# Patient Record
Sex: Male | Born: 1995 | Race: White | Hispanic: No | Marital: Single | Smoking: Current every day smoker
Health system: Southern US, Community
[De-identification: ages and names within clinical notes are randomized; demographics above are authoritative.]

---

## 2015-01-10 ENCOUNTER — Ambulatory Visit
Admit: 2015-01-10 | Disposition: A | Discharge status: Home / Self Care | Payer: Self-pay | Attending: Family Medicine | Admitting: Family Medicine

## 2015-01-16 ENCOUNTER — Ambulatory Visit
Admit: 2015-01-16 | Disposition: A | Discharge status: Home / Self Care | Payer: Self-pay | Attending: Family Medicine | Admitting: Family Medicine

## 2015-01-20 ENCOUNTER — Ambulatory Visit
Admit: 2015-01-20 | Disposition: A | Discharge status: Home / Self Care | Payer: Self-pay | Attending: Family Medicine | Admitting: Family Medicine

## 2015-09-13 ENCOUNTER — Ambulatory Visit
Admission: EM | Admit: 2015-09-13 | Discharge: 2015-09-13 | Disposition: A | Discharge status: Home / Self Care | Payer: 59 | Attending: Family Medicine | Admitting: Family Medicine

## 2015-09-13 DIAGNOSIS — J020 Streptococcal pharyngitis: Secondary | ICD-10-CM

## 2015-09-13 LAB — RAPID STREP SCREEN (MED CTR MEBANE ONLY): Streptococcus, Group A Screen (Direct): POSITIVE — AB

## 2015-09-13 MED ORDER — DEXAMETHASONE SODIUM PHOSPHATE 10 MG/ML IJ SOLN
10.0000 mg | Freq: Once | INTRAMUSCULAR | Status: AC
  Administered 2015-09-13: 10 mg via INTRAMUSCULAR

## 2015-09-13 MED ORDER — PENICILLIN G BENZATHINE & PROC 900000-300000 UNIT/2ML IM SUSP
1.2000 10*6.[IU] | Freq: Once | INTRAMUSCULAR | Status: AC
Start: 2015-09-13 — End: 2015-09-13
  Administered 2015-09-13: 0.9 10*6.[IU] via INTRAMUSCULAR

## 2015-09-13 NOTE — ED Notes (Signed)
Woke this morning with sore throat. Pain with swallowing

## 2015-09-13 NOTE — ED Provider Notes (Signed)
[facility]  ____________________________________________  Time seen: Approximately 2:09 PM  I have reviewed the triage vital signs and the nursing notes.   HISTORY  Chief Complaint Sore Throat   HPI Shawn Chavez is a 19 y.o. male presents for complaints of sore throat. States woke up with sore throat this am. States hurts to swallow but able to continue to eat and drink. States current sore throat pain is 6/10. Denies pain radiation or aggravating factors. Reports has not taken any medications today.   Denies cough, congestion, headache, fevers, vomiting, diarrhea. Reports no sick contacts at home, but reports works at [employer] and frequently exposed to sick people.   History reviewed. No pertinent past medical history.  There are no active problems to display for this patient.   History reviewed. No pertinent past surgical history.  No current outpatient prescriptions on file.  Allergies Review of patient's allergies indicates no known allergies.  Family History  Problem Relation Age of Onset  . Cancer Mother     Social History Social History  Substance Use Topics  . Smoking status: Current Every Day Smoker -- 0.50 packs/day    Types: Cigarettes  . Smokeless tobacco: None  . Alcohol Use: Yes     Comment: rarely    Review of Systems Constitutional: No fever/chills Eyes: No visual changes. ENT: positive sore throat. Cardiovascular: Denies chest pain. Respiratory: Denies shortness of breath. Gastrointestinal: No abdominal pain.  No nausea, no vomiting.  No diarrhea.  No constipation. Genitourinary: Negative for dysuria. Musculoskeletal: Negative for back pain. Skin: Negative for rash. Neurological: Negative for headaches, focal weakness or numbness.  10-point ROS otherwise negative.  ____________________________________________   PHYSICAL EXAM:  VITAL SIGNS: ED Triage Vitals  Enc Vitals Group     BP 09/13/15 1323 133/78 mmHg     Pulse  Rate 09/13/15 1323 99     Resp 09/13/15 1323 16     Temp 09/13/15 1323 98.3 F (36.8 C)     Temp Source 09/13/15 1323 Tympanic     SpO2 09/13/15 1323 100 %     Weight 09/13/15 1323 240 lb (108.863 kg)     Height 09/13/15 1323  (1.905 m)     Head Cir --      Peak Flow --      Pain Score 09/13/15 1325 5     Pain Loc --      Pain Edu? --      Excl. in GC? --     Constitutional: Alert and oriented. Well appearing and in no acute distress. Eyes: Conjunctivae are normal. PERRL. EOMI. Head: Atraumatic.nontender. No swelling.   Ears: no erythema, normal TMs bilaterally.   Nose: No congestion/rhinnorhea.  Mouth/Throat: Mucous membranes are moist.  Mod pharyngeal erythema, 2+ bilateral tonsillar swelling, no exudate. No uvular shift or deviation. Neck: No stridor.  No cervical spine tenderness to palpation. Hematological/Lymphatic/Immunilogical: mild anterior cervical lymphadenopathy. Cardiovascular: Normal rate, regular rhythm. Grossly normal heart sounds.  Good peripheral circulation. Respiratory: Normal respiratory effort.  No retractions. Lungs CTAB. Gastrointestinal: Soft and nontender. Normal Bowel sounds.No hepatomegaly or splenomegaly palpated.  Musculoskeletal: No lower or upper extremity tenderness nor edema.  Neurologic:  Normal speech and language. No gross focal neurologic deficits are appreciated. No gait instability. Skin:  Skin is warm, dry and intact. No rash noted. Psychiatric: Mood and affect are normal. Speech and behavior are normal.  ____________________________________________   LABS (all labs ordered are listed, but only abnormal results are displayed)  Labs Reviewed  RAPID STREP SCREEN (NOT AT [facility]) - Abnormal; Notable for the following:    Streptococcus, Group A Screen (Direct) POSITIVE (*)    All other components within normal limits    INITIAL IMPRESSION / ASSESSMENT AND PLAN / ED COURSE  Pertinent labs & imaging results that were available during  my care of the patient were reviewed by me and considered in my medical decision making (see chart for details).  Very well appearing. No acute distress. Sore throat x one day. Continues to eat and drink . Lungs clear throughout. Strep positive. Discussed treatment, patient opted to received Bicillin, Bicillin 1.2 million units given once in Urgent care. 10 mg im decadron also given. Discussed supportive treatments, rest, fluids, otc tylenol or ibuprofen as needed.   Discussed follow up with Primary care physician this week. Discussed follow up and return parameters including no resolution or any worsening concerns. Patient verbalized understanding and agreed to plan.   ____________________________________________   FINAL CLINICAL IMPRESSION(S) / ED DIAGNOSES  Final diagnoses:  Strep throat       Renford DillsLindsey Haider Hornaday, NP 09/13/15 1453

## 2015-09-13 NOTE — Discharge Instructions (Signed)
Rest. Take over the counter tylenol or ibuprofen as needed. Eat and drink well.   Follow up with your primary care physician this week as needed. Return to Urgent care for new or worsening concerns.   Strep Throat Strep throat is a bacterial infection of the throat. Your health care provider may call the infection tonsillitis or pharyngitis, depending on whether there is swelling in the tonsils or at the back of the throat. Strep throat is most common during the cold months of the year in children who are 78-25 years of age, but it can happen during any season in people of any age. This infection is spread from person to person (contagious) through coughing, sneezing, or close contact. CAUSES Strep throat is caused by the bacteria called Streptococcus pyogenes. RISK FACTORS This condition is more likely to develop in:  People who spend time in crowded places where the infection can spread easily.  People who have close contact with someone who has strep throat. SYMPTOMS Symptoms of this condition include:  Fever or chills.   Redness, swelling, or pain in the tonsils or throat.  Pain or difficulty when swallowing.  White or yellow spots on the tonsils or throat.  Swollen, tender glands in the neck or under the jaw.  Red rash all over the body (rare). DIAGNOSIS This condition is diagnosed by performing a rapid strep test or by taking a swab of your throat (throat culture test). Results from a rapid strep test are usually ready in a few minutes, but throat culture test results are available after one or two days. TREATMENT This condition is treated with antibiotic medicine. HOME CARE INSTRUCTIONS Medicines  Take over-the-counter and prescription medicines only as told by your health care provider.  Take your antibiotic as told by your health care provider. Do not stop taking the antibiotic even if you start to feel better.  Have family members who also have a sore throat or fever  tested for strep throat. They may need antibiotics if they have the strep infection. Eating and Drinking  Do not share food, drinking cups, or personal items that could cause the infection to spread to other people.  If swallowing is difficult, try eating soft foods until your sore throat feels better.  Drink enough fluid to keep your urine clear or pale yellow. General Instructions  Gargle with a salt-water mixture 3-4 times per day or as needed. To make a salt-water mixture, completely dissolve -1 tsp of salt in 1 cup of warm water.  Make sure that all household members wash their hands well.  Get plenty of rest.  Stay home from school or work until you have been taking antibiotics for 24 hours.  Keep all follow-up visits as told by your health care provider. This is important. SEEK MEDICAL CARE IF:  The glands in your neck continue to get bigger.  You develop a rash, cough, or earache.  You cough up a thick liquid that is green, yellow-brown, or bloody.  You have pain or discomfort that does not get better with medicine.  Your problems seem to be getting worse rather than better.  You have a fever. SEEK IMMEDIATE MEDICAL CARE IF:  You have new symptoms, such as vomiting, severe headache, stiff or painful neck, chest pain, or shortness of breath.  You have severe throat pain, drooling, or changes in your voice.  You have swelling of the neck, or the skin on the neck becomes red and tender.  You have  signs of dehydration, such as fatigue, dry mouth, and decreased urination.  You become increasingly sleepy, or you cannot wake up completely.  Your joints become red or painful.   This information is not intended to replace advice given to you by your health care provider. Make sure you discuss any questions you have with your health care provider.   Document Released: 09/05/2000 Document Revised: 05/30/2015 Document Reviewed: 01/01/2015 Elsevier Interactive Patient  Education [employer]2016 [employer]

## 2015-12-13 IMAGING — CR DG KNEE COMPLETE 4+V*L*
4 series · 4 of 4 positions shown · non-contrast
Comparison: None.

CLINICAL DATA: Left posterior knee pain x3 weeks, no known injury

EXAM:
LEFT KNEE - COMPLETE 4+ VIEW

[knee ap]
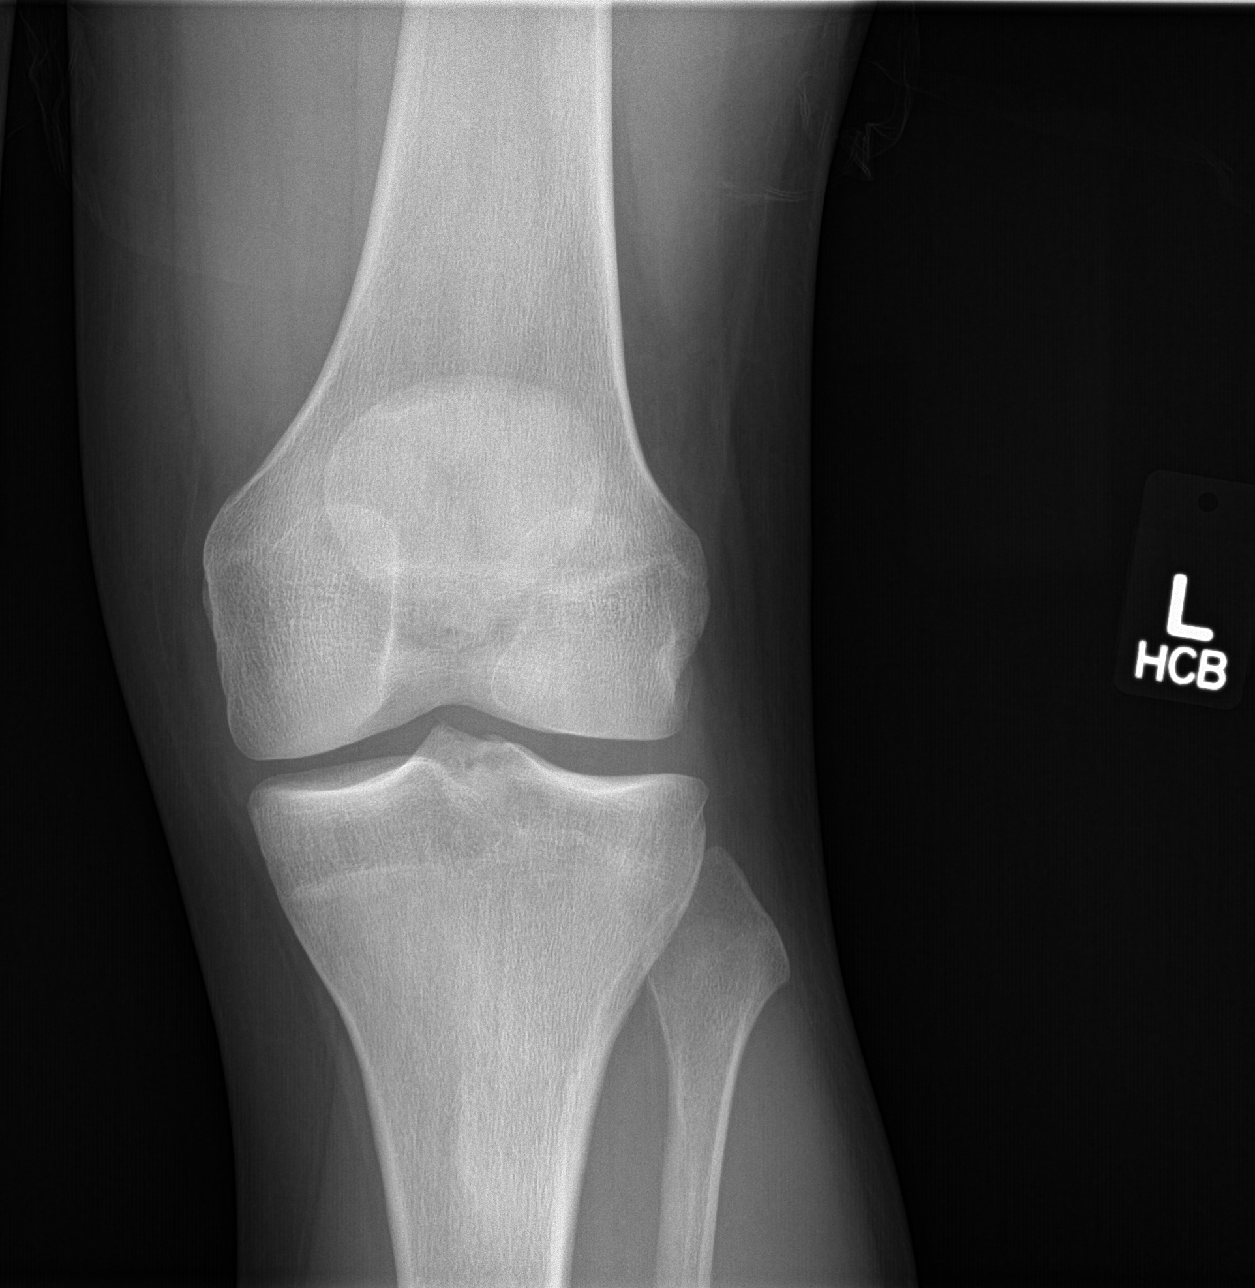

[knee obl (1 of 2)]
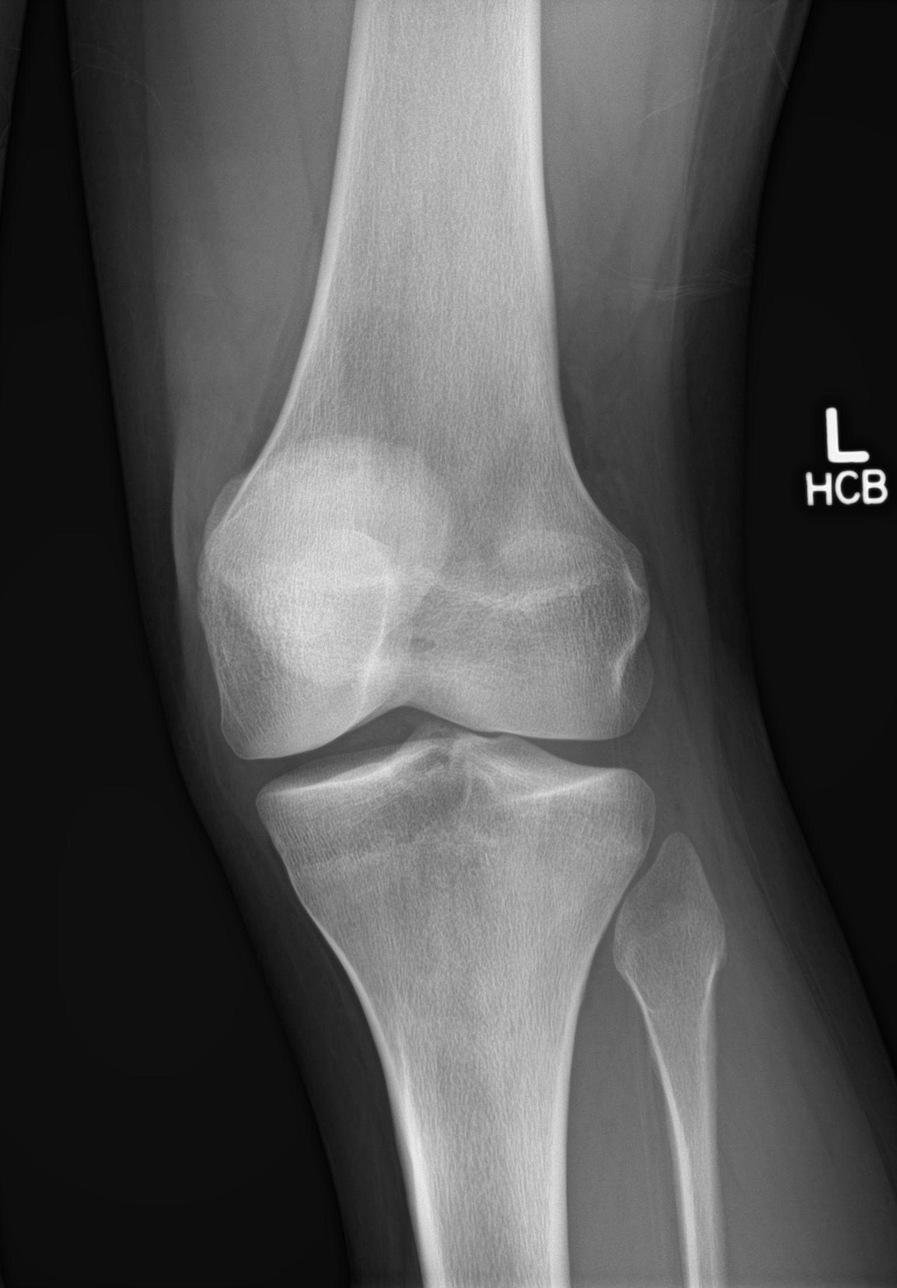

[knee obl (2 of 2)]
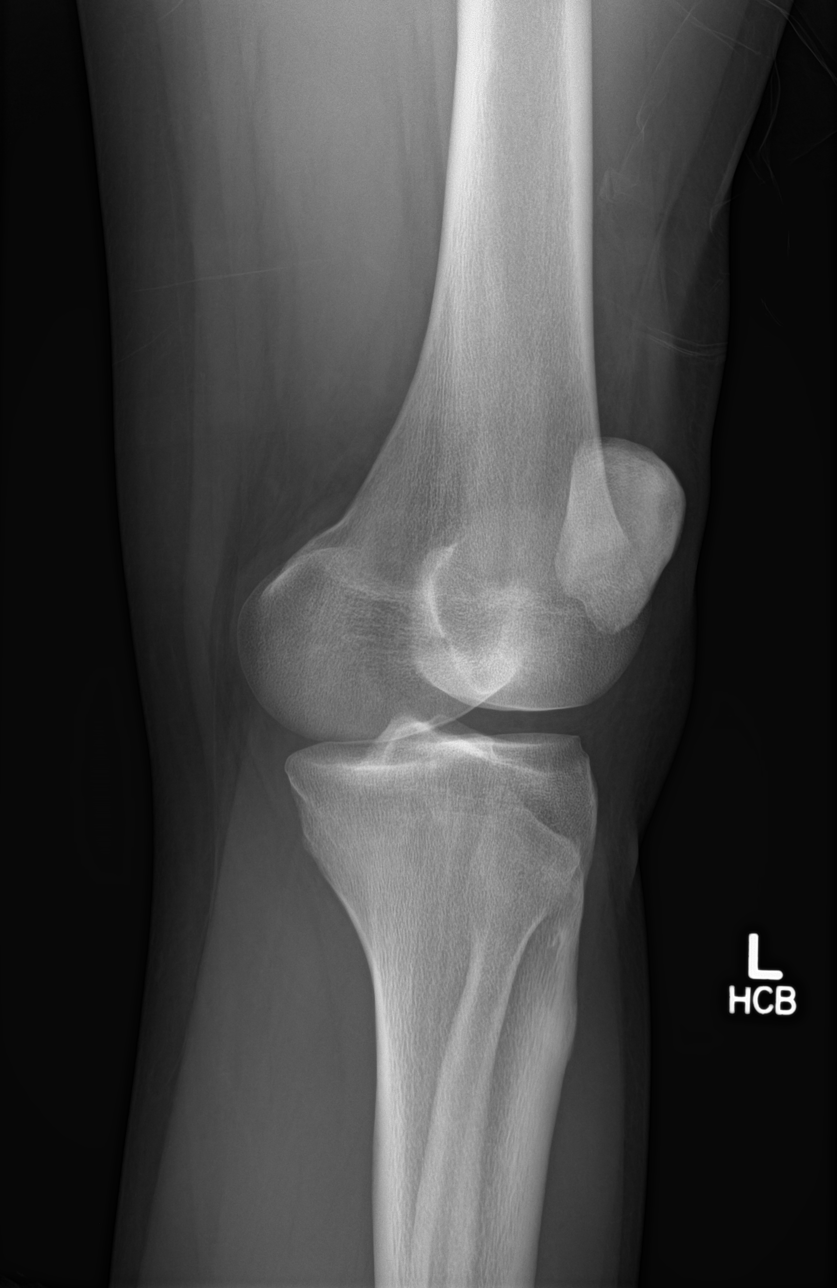

[knee lat]
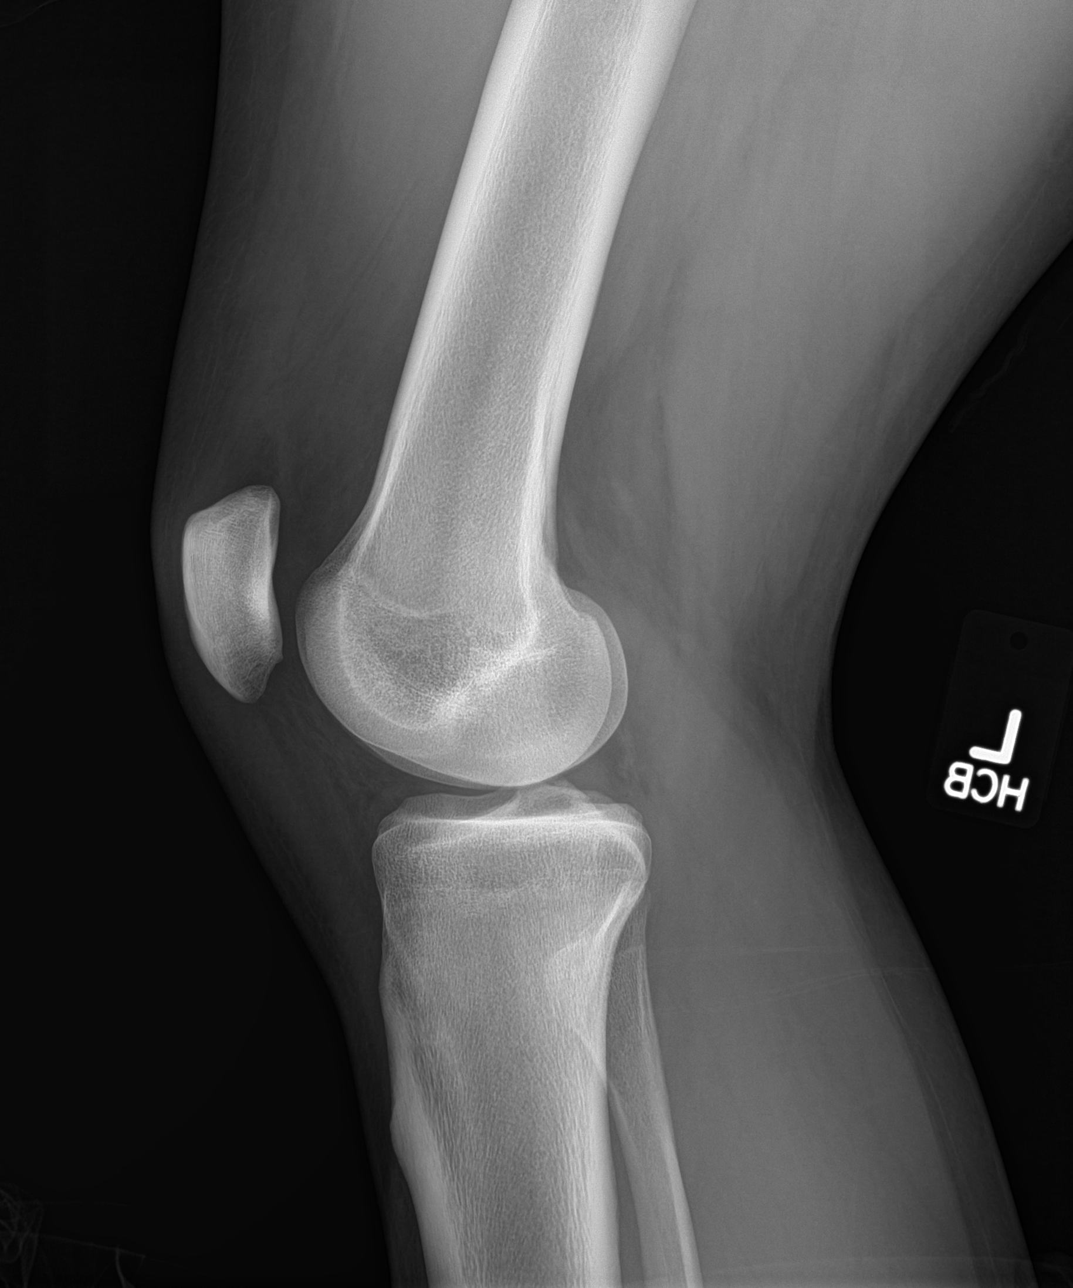

[4 of 4 positions shown; findings below may reference images not displayed]

FINDINGS: No fracture or dislocation is seen.

The joint spaces are preserved.

The visualized soft tissues are unremarkable.

No suprapatellar knee joint effusion.
IMPRESSION: No acute osseous abnormality is seen.
